# Patient Record
Sex: Male | Born: 1970 | Race: Black or African American | Hispanic: No | Marital: Married | Smoking: Never smoker
Health system: Southern US, Community
[De-identification: ages and names within clinical notes are randomized; demographics above are authoritative.]

## PROBLEM LIST (undated history)

## (undated) DIAGNOSIS — M7662 Achilles tendinitis, left leg: Secondary | ICD-10-CM

## (undated) DIAGNOSIS — I10 Essential (primary) hypertension: Secondary | ICD-10-CM

## (undated) DIAGNOSIS — I493 Ventricular premature depolarization: Secondary | ICD-10-CM

## (undated) DIAGNOSIS — E785 Hyperlipidemia, unspecified: Secondary | ICD-10-CM

## (undated) DIAGNOSIS — I160 Hypertensive urgency: Secondary | ICD-10-CM

## (undated) DIAGNOSIS — E119 Type 2 diabetes mellitus without complications: Secondary | ICD-10-CM

## (undated) DIAGNOSIS — M7661 Achilles tendinitis, right leg: Secondary | ICD-10-CM

## (undated) HISTORY — DX: Hypertensive urgency: I16.0

## (undated) HISTORY — DX: Achilles tendinitis, right leg: M76.61

## (undated) HISTORY — DX: Type 2 diabetes mellitus without complications: E11.9

## (undated) HISTORY — DX: Achilles tendinitis, right leg: M76.62

## (undated) HISTORY — DX: Ventricular premature depolarization: I49.3

---

## 2012-11-06 ENCOUNTER — Encounter (HOSPITAL_BASED_OUTPATIENT_CLINIC_OR_DEPARTMENT_OTHER): Payer: Self-pay | Admitting: Emergency Medicine

## 2012-11-06 ENCOUNTER — Emergency Department (HOSPITAL_BASED_OUTPATIENT_CLINIC_OR_DEPARTMENT_OTHER)
Admission: EM | Admit: 2012-11-06 | Discharge: 2012-11-06 | Disposition: A | Discharge status: Home / Self Care | Payer: Worker's Compensation | Attending: Emergency Medicine | Admitting: Emergency Medicine

## 2012-11-06 DIAGNOSIS — Y929 Unspecified place or not applicable: Secondary | ICD-10-CM | POA: Insufficient documentation

## 2012-11-06 DIAGNOSIS — Y990 Civilian activity done for income or pay: Secondary | ICD-10-CM | POA: Insufficient documentation

## 2012-11-06 DIAGNOSIS — S81811A Laceration without foreign body, right lower leg, initial encounter: Secondary | ICD-10-CM

## 2012-11-06 DIAGNOSIS — Z8639 Personal history of other endocrine, nutritional and metabolic disease: Secondary | ICD-10-CM | POA: Insufficient documentation

## 2012-11-06 DIAGNOSIS — Z862 Personal history of diseases of the blood and blood-forming organs and certain disorders involving the immune mechanism: Secondary | ICD-10-CM | POA: Insufficient documentation

## 2012-11-06 DIAGNOSIS — S81809A Unspecified open wound, unspecified lower leg, initial encounter: Secondary | ICD-10-CM | POA: Insufficient documentation

## 2012-11-06 DIAGNOSIS — S81009A Unspecified open wound, unspecified knee, initial encounter: Secondary | ICD-10-CM | POA: Insufficient documentation

## 2012-11-06 DIAGNOSIS — Z79899 Other long term (current) drug therapy: Secondary | ICD-10-CM | POA: Insufficient documentation

## 2012-11-06 DIAGNOSIS — I10 Essential (primary) hypertension: Secondary | ICD-10-CM | POA: Insufficient documentation

## 2012-11-06 DIAGNOSIS — Y9301 Activity, walking, marching and hiking: Secondary | ICD-10-CM | POA: Insufficient documentation

## 2012-11-06 DIAGNOSIS — W268XXA Contact with other sharp object(s), not elsewhere classified, initial encounter: Secondary | ICD-10-CM | POA: Insufficient documentation

## 2012-11-06 DIAGNOSIS — Z23 Encounter for immunization: Secondary | ICD-10-CM | POA: Insufficient documentation

## 2012-11-06 HISTORY — DX: Hyperlipidemia, unspecified: E78.5

## 2012-11-06 HISTORY — DX: Essential (primary) hypertension: I10

## 2012-11-06 MED ORDER — TETANUS-DIPHTH-ACELL PERTUSSIS 5-2.5-18.5 LF-MCG/0.5 IM SUSP
0.5000 mL | Freq: Once | INTRAMUSCULAR | Status: AC
  Administered 2012-11-06: 0.5 mL via INTRAMUSCULAR
  Filled 2012-11-06: qty 0.5

## 2012-11-06 MED ORDER — LIDOCAINE-EPINEPHRINE 2 %-1:100000 IJ SOLN
INTRAMUSCULAR | Status: AC
  Administered 2012-11-06: 03:00:00
  Filled 2012-11-06: qty 1

## 2012-11-06 NOTE — ED Notes (Signed)
Pt reports that he is on one additional bp medication and cholesterol medication but is unsure of the names

## 2012-11-06 NOTE — ED Notes (Signed)
Pt states that he had he was walking around at work and hit a nail with anterior lateral leg causing a puncture wound

## 2012-11-06 NOTE — ED Provider Notes (Signed)
History     CSN: [id number]  Arrival date & time 11/06/12  0209   First MD Initiated Contact with Patient 11/06/12 0226      Chief Complaint  Patient presents with  . Leg Injury    (Consider location/radiation/quality/duration/timing/severity/associated sxs/prior treatment) Patient is a 42 y.o. male presenting with skin laceration. The history is provided by the patient.  Laceration Location:  Leg Leg laceration location:  R lower leg Length (cm):  3 Depth:  Through underlying tissue Quality: jagged   Bleeding: controlled   Injury mechanism: Patient lacerated leg on nail sticking out of board at work.  Went through lower leg and was sticking out.  Pain details:    Quality:  Aching   Severity:  Mild   Timing:  Constant Foreign body present:  No foreign bodies Relieved by:  Nothing Worsened by:  Nothing tried Ineffective treatments:  None tried Tetanus status:  Out of date   Past Medical History  Diagnosis Date  . Hypertension   . Hyperlipidemia     History reviewed. No pertinent past surgical history.  History reviewed. No pertinent family history.  History  Substance Use Topics  . Smoking status: Never Smoker   . Smokeless tobacco: Not on file  . Alcohol Use: Yes     Comment: occasionally      Review of Systems  All other systems reviewed and are negative.    Allergies  Review of patient's allergies indicates no known allergies.  Home Medications   Current Outpatient Rx  Name  Route  Sig  Dispense  Refill  . lisinopril (PRINIVIL,ZESTRIL) 10 MG tablet   Oral   Take 10 mg by mouth daily.           BP 154/106  Pulse 103  Temp(Src) 98.5 F (36.9 C) (Oral)  Resp 18  SpO2 98%  Physical Exam  Vitals reviewed. Constitutional: He is oriented to person, place, and time. He appears well-developed and well-nourished.  HENT:  Head: Normocephalic and atraumatic.  Eyes: Pupils are equal, round, and reactive to light.  Musculoskeletal: Normal  range of motion. He exhibits no edema.       Legs: 3 cm entry wound with .5 cm exit.  Probe connects wounds and irrigation through both.   Neurological: He is alert and oriented to person, place, and time. He has normal strength and normal reflexes. No sensory deficit.    ED Course  LACERATION REPAIR Date/Time: 11/06/2012 2:46 AM Performed by: Hilario Quarry Authorized by: Hilario Quarry Consent: Verbal consent not obtained. Risks and benefits: risks, benefits and alternatives were discussed Patient identity confirmed: verbally with patient Time out: Immediately prior to procedure a "time out" was called to verify the correct patient, procedure, equipment, support staff and site/side marked as required. Body area: lower extremity Location details: right lower leg Foreign bodies: unknown Tendon involvement: none Nerve involvement: none Anesthesia: local infiltration Local anesthetic: lidocaine 1% with epinephrine Anesthetic total: 2 ml Patient sedated: no Preparation: Patient was prepped and draped in the usual sterile fashion. Irrigation solution: saline Irrigation method: syringe Amount of cleaning: extensive Debridement: moderate Degree of undermining: extensive Skin closure: staples Number of sutures: 3 Technique: simple Approximation difficulty: simple Dressing: 4x4 sterile gauze   (including critical care time)  Labs Reviewed - No data to display No results found.   No diagnosis found.    MDM    Discussed with patient to be especially vigilant for s/s infection and recheck if any, ow  patient to f/u for staple removal in 8 days.       Hilario Quarry, MD 11/07/12 [phone]

## 2020-01-06 ENCOUNTER — Other Ambulatory Visit: Payer: Self-pay

## 2020-01-06 ENCOUNTER — Ambulatory Visit (INDEPENDENT_AMBULATORY_CARE_PROVIDER_SITE_OTHER): Payer: BC Managed Care – PPO

## 2020-01-06 ENCOUNTER — Ambulatory Visit: Payer: BC Managed Care – PPO | Admitting: Podiatry

## 2020-01-06 ENCOUNTER — Encounter: Payer: Self-pay | Admitting: Podiatry

## 2020-01-06 DIAGNOSIS — M19072 Primary osteoarthritis, left ankle and foot: Secondary | ICD-10-CM | POA: Diagnosis not present

## 2020-01-06 DIAGNOSIS — M19071 Primary osteoarthritis, right ankle and foot: Secondary | ICD-10-CM | POA: Diagnosis not present

## 2020-01-06 DIAGNOSIS — M7661 Achilles tendinitis, right leg: Secondary | ICD-10-CM

## 2020-01-06 DIAGNOSIS — M778 Other enthesopathies, not elsewhere classified: Secondary | ICD-10-CM

## 2020-01-06 DIAGNOSIS — M7662 Achilles tendinitis, left leg: Secondary | ICD-10-CM

## 2020-01-06 MED ORDER — MELOXICAM 15 MG PO TABS: 15.0000 mg | ORAL_TABLET | Freq: Every day | ORAL | 1 refills | Status: AC

## 2020-01-06 NOTE — Progress Notes (Signed)
   HPI: 49 y.o. male presenting today as a new patient for evaluation of bilateral foot pain is been going on for several years.  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  By the end of his shift he has significant pain and tenderness.  He denies injury.  He has tried some stability shoes in the past that have helped.  He presents for further treatment evaluation  Past Medical History:  Diagnosis Date  . Hyperlipidemia   . Hypertension      Physical Exam: General: The patient is alert and oriented x3 in no acute distress.  Dermatology: Skin is warm, dry and supple bilateral lower extremities. Negative for open lesions or macerations.  Vascular: Palpable pedal pulses bilaterally. No edema or erythema noted. Capillary refill within normal limits.  Neurological: Epicritic and protective threshold grossly intact bilaterally.   Musculoskeletal Exam: Range of motion within normal limits to all pedal and ankle joints bilateral. Muscle strength 5/5 in all groups bilateral.  There is collapse of the medial longitudinal arch consistent with pes planus.  There is also pain on palpation to the left Achilles tendon consistent with an Achilles tendinitis  Radiographic Exam:  Normal osseous mineralization. Joint spaces preserved. No fracture/dislocation/boney destruction.  Pes planus noted on lateral view with a decreased calcaneal inclination angle and metatarsal declination angle.  Assessment: 1.  Achilles tendinitis left 2.  Pes planus bilateral 3.  Inflammatory capsulitis both feet   Plan of Care:  1. Patient evaluated. X-Rays reviewed.  2.  Appointment with Pedorthist for custom molded orthotics 3.  Prescription for meloxicam 15 mg daily as needed 4.  Continue wearing good stability shoes at work 5.  Return to clinic as needed      Felecia Shelling, DPM [facility]  Dr. Felecia Shelling, DPM    [address] [place], [place] [address]                Office [phone]  Fax [phone]

## 2020-01-20 ENCOUNTER — Other Ambulatory Visit: Payer: BC Managed Care – PPO | Admitting: Orthotics

## 2020-12-02 ENCOUNTER — Other Ambulatory Visit: Payer: Self-pay | Admitting: Orthopedic Surgery

## 2020-12-02 DIAGNOSIS — M542 Cervicalgia: Secondary | ICD-10-CM

## 2020-12-02 DIAGNOSIS — M545 Low back pain, unspecified: Secondary | ICD-10-CM

## 2020-12-19 ENCOUNTER — Ambulatory Visit
Admission: RE | Admit: 2020-12-19 | Discharge: 2020-12-19 | Disposition: A | Discharge status: Home / Self Care | Payer: BC Managed Care – PPO | Source: Ambulatory Visit | Attending: Orthopedic Surgery | Admitting: Orthopedic Surgery

## 2020-12-19 ENCOUNTER — Other Ambulatory Visit: Payer: Self-pay

## 2020-12-19 DIAGNOSIS — M542 Cervicalgia: Secondary | ICD-10-CM

## 2020-12-19 DIAGNOSIS — M545 Low back pain, unspecified: Secondary | ICD-10-CM

## 2021-07-10 IMAGING — MR MR LUMBAR SPINE W/O CM
4 of 5 series · 22 of 48 positions shown · non-contrast
Comparison: None.

CLINICAL DATA: 49-year-old male with low back pain radiating down
the left leg onset 10 weeks ago when standing up from beach chair.

EXAM:
MRI LUMBAR SPINE WITHOUT CONTRAST
TECHNIQUE: Multiplanar, multisequence MR imaging of the lumbar spine was
performed. No intravenous contrast was administered.

[Series 5: T2 · sagittal · 4.0mm · 1.09mm/px · 6 of 15 slices shown (1 of 2)]
[im 1/15]
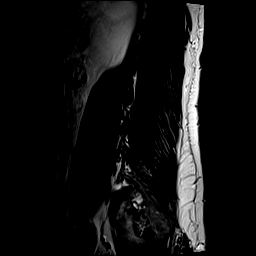
[im 3/15]
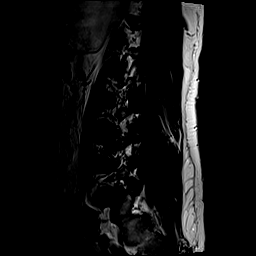
[im 6/15]
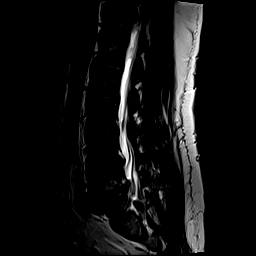
[im 9/15]
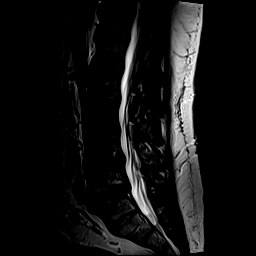
[im 12/15]
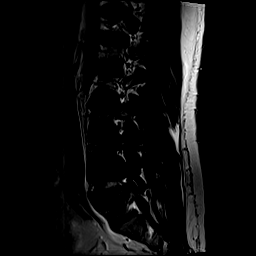
[im 15/15]
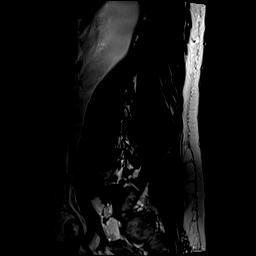

[Series 6: T1 · sagittal · 4.0mm · 1.09mm/px · 4 of 15 slices shown (1 of 2)]
[im 1/15]
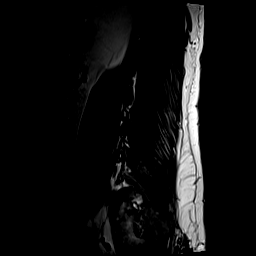
[im 3/15]
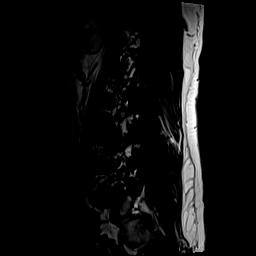
[im 9/15]
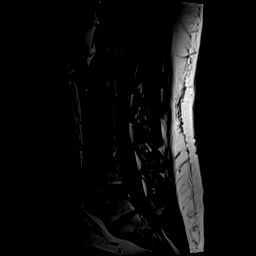
[im 15/15]
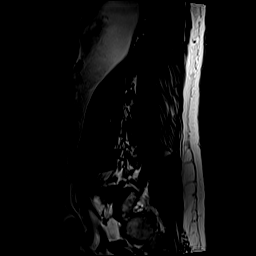

[Series 10: T1 · axial · 4.0mm · 0.28mm/px · z∈[-59,+89]mm · 3 of 39 slices shown (2 of 2)]
[im 6/39]
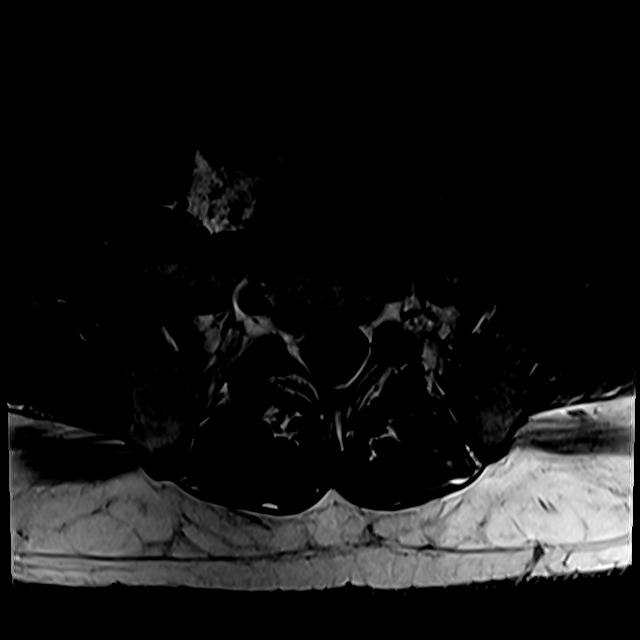
[im 20/39]
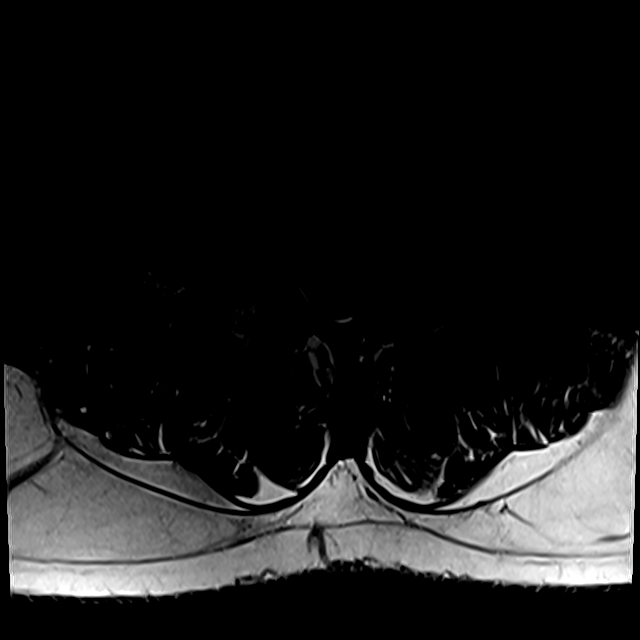
[im 33/39]
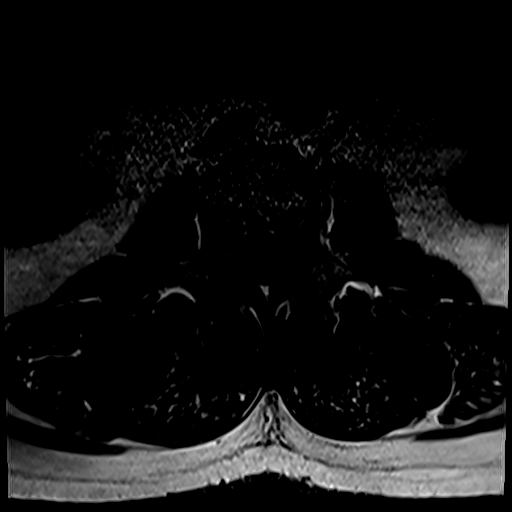

[Series 13: T2 · axial · 4.0mm · 0.28mm/px · z∈[-84,+119]mm · 9 of 39 slices shown (2 of 2)]
[im 1/39]
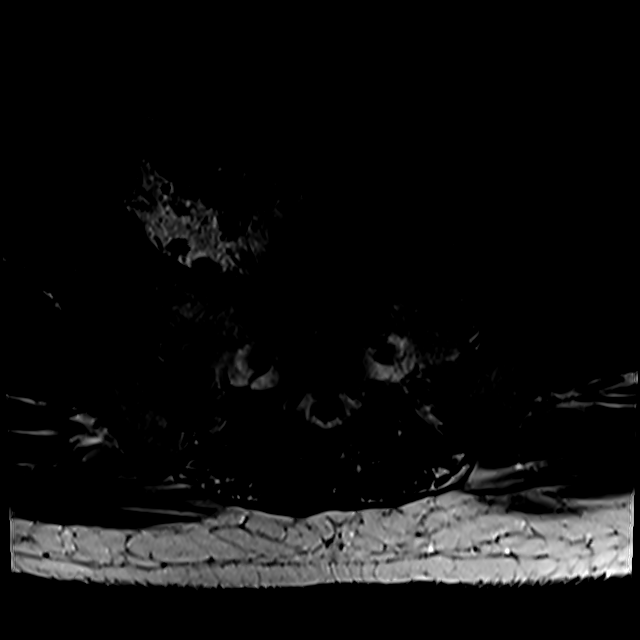
[im 6/39]
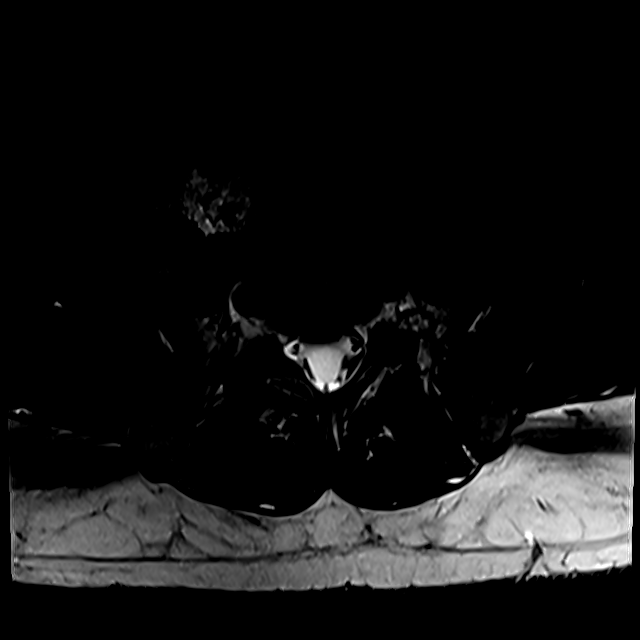
[im 11/39]
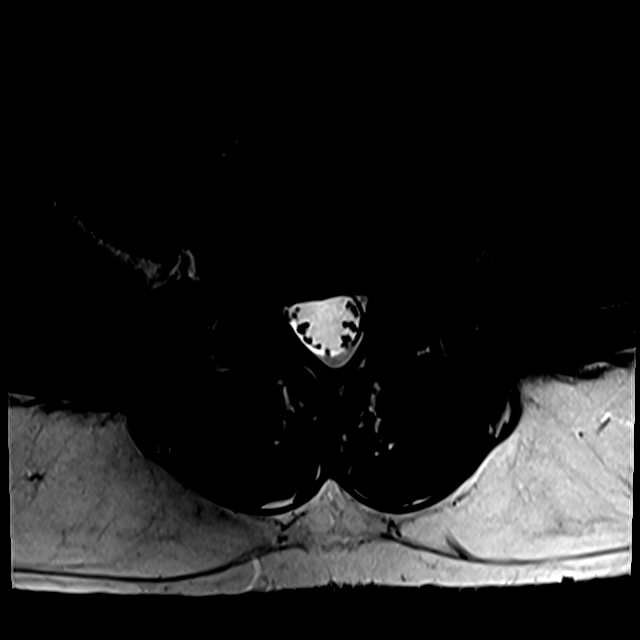
[im 17/39]
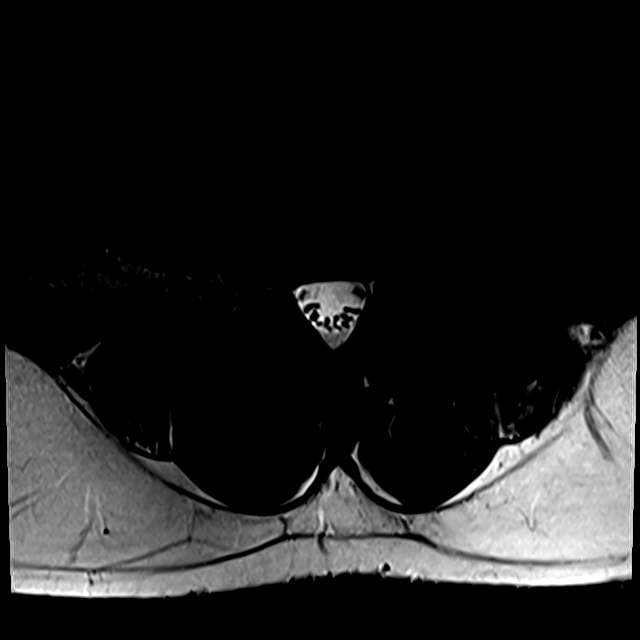
[im 20/39]
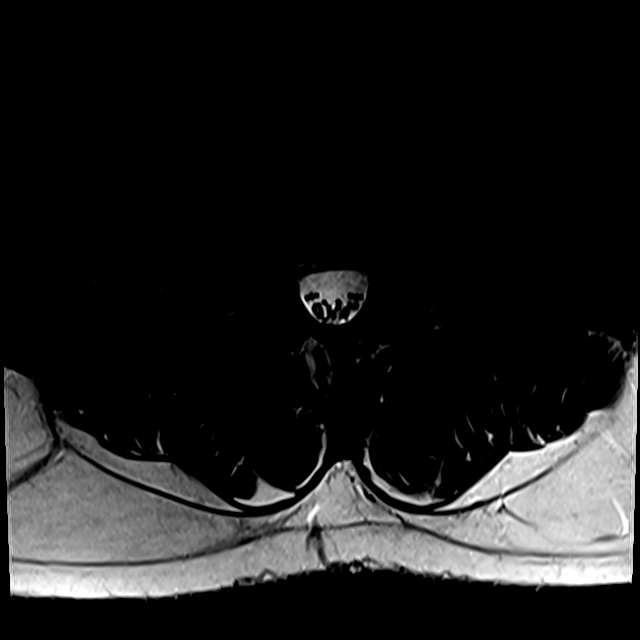
[im 22/39]
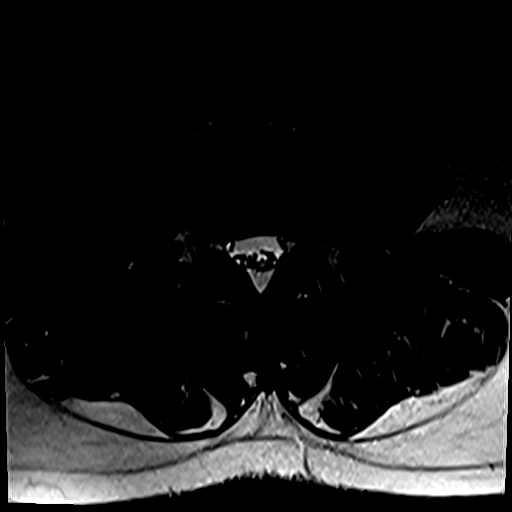
[im 28/39]
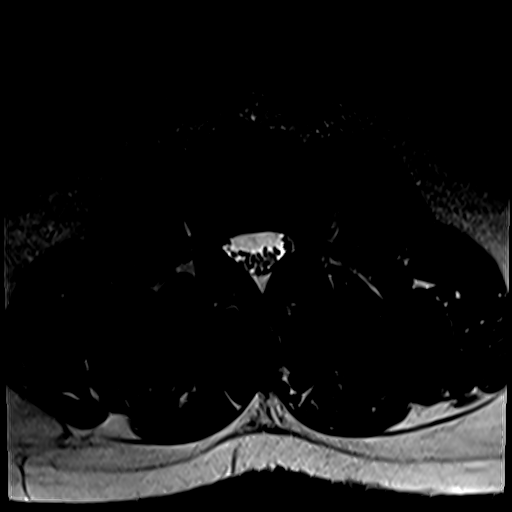
[im 33/39]
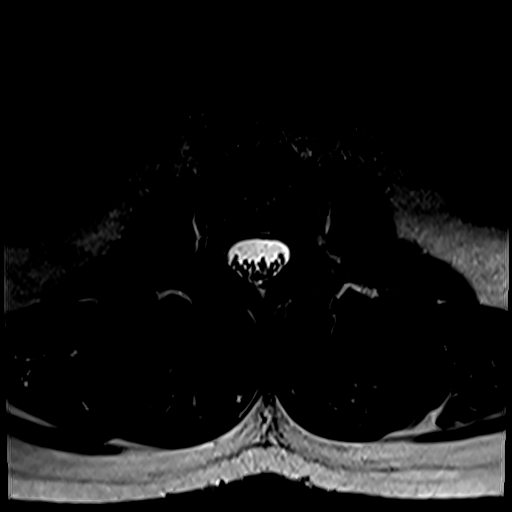
[im 39/39]
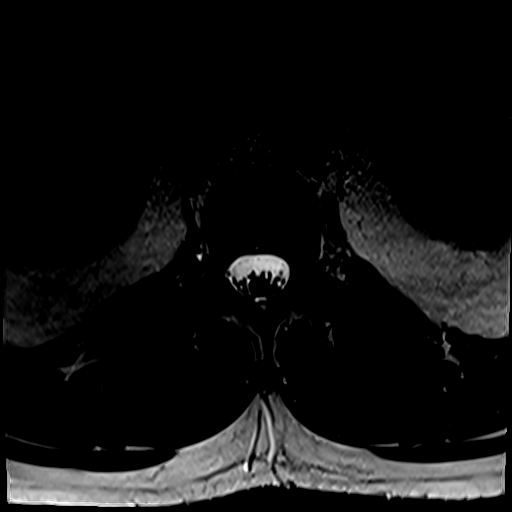

[22 of 48 positions shown; findings below may reference images not displayed]

FINDINGS: Segmentation: Mildly transitional anatomy. For the purposes of this
report the lowest visible ribs are designated T12, resulting in a
vestigial S1-S2 disc space, although the S1 level otherwise appears
fully sacralized. Correlation with radiographs is recommended prior
to any operative intervention.

Alignment: Mild straightening of lumbar lordosis. No significant
spondylolisthesis.

Vertebrae: Chronic degenerative endplate marrow signal changes at
L5-S1. Lesser chronic endplate degeneration at L4-L5. Background
bone marrow signal is within normal limits. No marrow edema or
evidence of acute osseous abnormality. Intact visible sacrum and SI
joints.

Conus medullaris and cauda equina: Conus extends to the L1 level. No
lower spinal cord or conus signal abnormality.

Paraspinal and other soft tissues: Negative.

Disc levels:

T11-T12: Partially visible left paracentral small disc protrusion.
Mild posterior element hypertrophy. No definite spinal stenosis. Up
to mild T11 foraminal stenosis.

T12-L1:  Negative.

L1-L2:  Negative.

L2-L3:  Negative.

L3-L4: Mild disc bulging greater on the left. Mild facet and
ligament flavum hypertrophy. No spinal or lateral recess stenosis,
but mild to moderate left L3 neural foraminal stenosis.

L4-L5: Disc desiccation and mild disc space loss. Circumferential
disc bulge. Mild facet hypertrophy. No spinal or lateral recess
stenosis. Mild bilateral L4 foraminal stenosis.

L5-S1: Disc desiccation, vacuum disc, disc space loss and
circumferential disc osteophyte complex eccentric to the right.
Broad-based bilateral foraminal involvement. Mild facet hypertrophy.
No spinal or lateral recess stenosis. Moderate to severe L5
foraminal stenosis greater on the left, where a component of left
foraminal disc herniation is difficult to exclude.

S1-S2: Vestigial disc space, otherwise negative.
IMPRESSION: 1. Mildly transitional anatomy. Correlation with radiographs is
recommended prior to any operative intervention.

2. Symptomatic level favored to be L5-S1 where there is advanced
chronic disc degeneration with endplate spurring. Severe involvement
of the left neural foramen, where a small component of foraminal
disc herniation is difficult to exclude. Subsequent up to severe
left and moderate right foraminal stenosis. Query Left L5
radiculitis.

3. There is also left foraminal disc bulging at L3-L4 with up to
moderate left foraminal stenosis. Query Left L3 radiculitis.

4. L4-L5 degeneration with only mild foraminal stenosis.

## 2022-02-13 ENCOUNTER — Other Ambulatory Visit: Payer: Self-pay

## 2022-02-13 ENCOUNTER — Emergency Department (HOSPITAL_BASED_OUTPATIENT_CLINIC_OR_DEPARTMENT_OTHER)
Admission: EM | Admit: 2022-02-13 | Discharge: 2022-02-13 | Disposition: A | Discharge status: Home / Self Care | Payer: BC Managed Care – PPO | Attending: Emergency Medicine | Admitting: Emergency Medicine

## 2022-02-13 ENCOUNTER — Emergency Department (HOSPITAL_BASED_OUTPATIENT_CLINIC_OR_DEPARTMENT_OTHER): Payer: BC Managed Care – PPO

## 2022-02-13 ENCOUNTER — Encounter (HOSPITAL_BASED_OUTPATIENT_CLINIC_OR_DEPARTMENT_OTHER): Payer: Self-pay | Admitting: Emergency Medicine

## 2022-02-13 DIAGNOSIS — I493 Ventricular premature depolarization: Secondary | ICD-10-CM | POA: Diagnosis not present

## 2022-02-13 DIAGNOSIS — E119 Type 2 diabetes mellitus without complications: Secondary | ICD-10-CM | POA: Diagnosis not present

## 2022-02-13 DIAGNOSIS — I10 Essential (primary) hypertension: Secondary | ICD-10-CM | POA: Diagnosis not present

## 2022-02-13 DIAGNOSIS — R079 Chest pain, unspecified: Secondary | ICD-10-CM | POA: Diagnosis present

## 2022-02-13 LAB — BASIC METABOLIC PANEL
Anion gap: 8 (ref 5–15)
BUN: 28 mg/dL — ABNORMAL HIGH (ref 6–20)
CO2: 24 mmol/L (ref 22–32)
Calcium: 9.1 mg/dL (ref 8.9–10.3)
Chloride: 106 mmol/L (ref 98–111)
Creatinine, Ser: 1.2 mg/dL (ref 0.61–1.24)
GFR, Estimated: >60 mL/min (ref >60)
Glucose, Bld: 102 mg/dL — ABNORMAL HIGH (ref 70–99)
Potassium: 3.7 mmol/L (ref 3.5–5.1)
Sodium: 138 mmol/L (ref 135–145)

## 2022-02-13 LAB — CBC
HCT: 42.2 % (ref 39.0–52.0)
Hemoglobin: 14.4 g/dL (ref 13.0–17.0)
MCH: 30.1 pg (ref 26.0–34.0)
MCHC: 34.1 g/dL (ref 30.0–36.0)
MCV: 88.1 fL (ref 80.0–100.0)
Platelets: 277 10*3/uL (ref 150–400)
RBC: 4.79 MIL/uL (ref 4.22–5.81)
RDW: 13.2 % (ref 11.5–15.5)
WBC: 7 10*3/uL (ref 4.0–10.5)
nRBC: 0 % (ref 0.0–0.2)

## 2022-02-13 LAB — TROPONIN I (HIGH SENSITIVITY): Troponin I (High Sensitivity): 3 ng/L (ref <18)

## 2022-02-13 NOTE — ED Provider Notes (Signed)
[facility] Provider Note: Ruben Dell, MD, FACEP  CSN: [id number] MRN: [id number] ARRIVAL: 02/13/22 at 0330 ROOM: MH10/[facility]10   CHIEF COMPLAINT  Chest Pain   HISTORY OF PRESENT ILLNESS  02/13/22 5:04 AM Ruben Tyler is a 51 y.o. male who has had about 2 weeks of intermittent chest pain.  The chest pain and is in his left upper chest.  It is sharp and lasts only 1 to 2 seconds at a time.  Nothing brings it on.  He has also been having a more achy, constant pain in his left arm, worse with bending of his left elbow.  He is not sure if the 2 pains are related.  Rates his pain as a 7 out of 10.  He is not having shortness of breath.   Past Medical History:  Diagnosis Date   Diabetes mellitus without complication (HCC)    Hyperlipidemia    Hypertension     No past surgical history on file.  No family history on file.  Social History   Tobacco Use   Smoking status: Never   Smokeless tobacco: Never  Substance Use Topics   Alcohol use: Yes    Comment: occasionally   Drug use: Never    Prior to Admission medications   Medication Sig Start Date End Date Taking? Authorizing Provider  lisinopril (PRINIVIL,ZESTRIL) 10 MG tablet Take 10 mg by mouth daily.    [provider]    Allergies Patient has no known allergies.   REVIEW OF SYSTEMS  Negative except as noted here or in the History of Present Illness.   PHYSICAL EXAMINATION  Initial Vital Signs Blood pressure (!) 150/100, pulse 85, temperature 98.6 F (37 C), temperature source Oral, height 5\' 10"  (1.778 m), weight 122.5 kg, SpO2 97 %.  Examination General: Well-developed, well-nourished male in no acute distress; appearance consistent with age of record HENT: normocephalic; atraumatic Eyes: pupils equal, round and reactive to light; extraocular muscles intact Neck: supple Heart: regular rate and rhythm Lungs: clear to auscultation bilaterally Abdomen: soft; nondistended; nontender; bowel  sounds present Extremities: No deformity; full range of motion; pulses normal Neurologic: Awake, alert and oriented; motor function intact in all extremities and symmetric; no facial droop Skin: Warm and dry Psychiatric: Normal mood and affect   RESULTS  Summary of this visit's results, reviewed and interpreted by myself:   EKG Interpretation  Date/Time:  Sunday February 13 2022 03:40:43 EDT Ventricular Rate:  84 PR Interval:  158 QRS Duration: 86 QT Interval:  370 QTC Calculation: 437 R Axis:   -36 Text Interpretation: Normal sinus rhythm Left axis deviation Otherwise normal ECG No previous ECGs available Confirmed by Macklyn Glandon, 03-01-1986 (Jonny Ruiz) on 02/13/2022 5:04:50 AM       Laboratory Studies: Results for orders placed or performed during the hospital encounter of 02/13/22 (from the past 24 hour(s))  Basic metabolic panel     Status: Abnormal   Collection Time: 02/13/22  3:57 AM  Result Value Ref Range   Sodium 138 135 - 145 mmol/L   Potassium 3.7 3.5 - 5.1 mmol/L   Chloride 106 98 - 111 mmol/L   CO2 24 22 - 32 mmol/L   Glucose, Bld 102 (H) 70 - 99 mg/dL   BUN 28 (H) 6 - 20 mg/dL   Creatinine, Ser 02/15/22 0.61 - 1.24 mg/dL   Calcium 9.1 8.9 - 3.78 mg/dL   GFR, Estimated 58.8 >50 mL/min   Anion gap 8 5 - 15  CBC  Status: None   Collection Time: 02/13/22  3:57 AM  Result Value Ref Range   WBC 7.0 4.0 - 10.5 K/uL   RBC 4.79 4.22 - 5.81 MIL/uL   Hemoglobin 14.4 13.0 - 17.0 g/dL   HCT 78.6 75.4 - 49.2 %   MCV 88.1 80.0 - 100.0 fL   MCH 30.1 26.0 - 34.0 pg   MCHC 34.1 30.0 - 36.0 g/dL   RDW 01.0 07.1 - 21.9 %   Platelets 277 150 - 400 K/uL   nRBC 0.0 0.0 - 0.2 %  Troponin I (High Sensitivity)     Status: None   Collection Time: 02/13/22  3:57 AM  Result Value Ref Range   Troponin I (High Sensitivity) 3 <18 ng/L   Imaging Studies: DG Chest 2 View  Result Date: 02/13/2022 CLINICAL DATA:  Chest pain and left arm pain for 2 weeks, initial encounter EXAM: CHEST - 2 VIEW  COMPARISON:  None Available. FINDINGS: The heart size and mediastinal contours are within normal limits. Both lungs are clear. The visualized skeletal structures are unremarkable. IMPRESSION: No active cardiopulmonary disease. Electronically Signed   By: Alcide Clever M.D.   On: 02/13/2022 03:59    ED COURSE and MDM  Nursing notes, initial and subsequent vitals signs, including pulse oximetry, reviewed and interpreted by myself.  Vitals:   02/13/22 0338 02/13/22 0338  BP:  (!) 150/100  Pulse:  85  Temp:  98.6 F (37 C)  TempSrc:  Oral  SpO2:  97%  Weight: 122.5 kg   Height: 5\' 10"  (1.778 m)    Medications - No data to display  The patient's EKG is nonischemic, his chest x-ray is unremarkable and his troponin is well within normal limits.  His rhythm strip was reviewed and he is having frequent PVCs but with no sustained runs.  I suspect his perceived sharp, transient pains are in fact PVCs.  We will refer him to cardiology for further work-up.  PROCEDURES  Procedures   ED DIAGNOSES     ICD-10-CM   1. PVC's (premature ventricular contractions)  I49.3 Ambulatory referral to Cardiology         , MD 02/13/22 [phone]

## 2022-02-13 NOTE — ED Triage Notes (Signed)
Chest pain and soreness in left arm X 2 weeks worse tonight with fatigue.

## 2022-03-14 NOTE — Progress Notes (Signed)
Cardiology Office Note:    Date:  03/15/2022   ID:  Ruben Tyler, DOB 1970/09/24, MRN [id number]  PCP:  Pcp, No  Cardiologist:  None   Referring MD: Paula Libra, MD   No chief complaint on file.   History of Present Illness:    Ruben Tyler is a 51 y.o. male with a hx of chest pain and referred to cardiology after recent ER visit.  ER history 02/17/2022: "51 y.o. male who has had about 2 weeks of intermittent chest pain. The chest pain and is in his left upper chest. It is sharp and lasts only 1 to 2 seconds at a time. Nothing brings it on. He has also been having a more achy, constant pain in his left arm, worse with bending of his left elbow. He is not sure if the 2 pains are related. Rates his pain as a 7 out of 10. He is not having shortness of breath."  He has had sharp shooting left parasternal chest discomfort off and on for 2 to 3 week.  It occurs randomly and duration is seconds..  There is no associated arm or neck discomfort.  He does not smoke cigarettes.  There is a family history of heart disease with mother dying of a heart problem, father having valvular heart disease, sister with high blood pressure, and another brother with an irregular heartbeat.  His grandfather, paternal, died of coronary artery disease.  He denies dyspnea on exertion, orthopnea, PND, palpitations, and any known diagnosis of heart disease.  Uses alcohol and caffeine.  Does not smoke.  20-year history of diabetes and hyperlipidemia along with high blood pressure.  Has not had any of his medications yet this morning.  He works the night shift at [employer] and has not made it home yet from work to take his medications.  He is accompanied by one of his daughters.  Past Medical History:  Diagnosis Date   Achilles tendinitis of both lower extremities    DM (diabetes mellitus) (HCC)    Hyperlipidemia    Hypertension    Hypertensive urgency    PVC (premature ventricular contraction)      History reviewed. No pertinent surgical history.  Current Medications: Current Meds  Medication Sig   atorvastatin (LIPITOR) 80 MG tablet Take 80 mg by mouth daily.   JANUMET XR [phone] MG TB24 Take 1 tablet by mouth daily.   meloxicam (MOBIC) 15 MG tablet Take 15 mg by mouth daily.   Olmesartan-amLODIPine-HCTZ 40-5-25 MG TABS Take 1 tablet by mouth daily.     Allergies:   Celecoxib   Social History   Socioeconomic History   Marital status: Married    Spouse name: Not on file   Number of children: Not on file   Years of education: Not on file   Highest education level: Not on file  Occupational History   Not on file  Tobacco Use   Smoking status: Never   Smokeless tobacco: Never  Substance and Sexual Activity   Alcohol use: Yes    Comment: occasionally   Drug use: Never   Sexual activity: Not on file  Other Topics Concern   Not on file  Social History Narrative   Not on file   Social Determinants of Health   Financial Resource Strain: Not on file  Food Insecurity: Not on file  Transportation Needs: Not on file  Physical Activity: Not on file  Stress: Not on file  Social Connections: Not on file  Family History: The patient's family history is not on file.  ROS:   Please see the history of present illness.    Snores.  He has to try to sleep during the daytime because he works the [employer] shift at work.  He does not sleep as well as he would like to.  He feels feels tired much of the time.  No lower extremity swelling.  He does sleep propped on pillows because it is more comfortable.  He has had lumbar disc/spine discomfort and has some radiation of pain into his left upper thigh.  All other systems reviewed and are negative.  EKGs/Labs/Other Studies Reviewed:    The following studies were reviewed today: Laboratory data 2022 [facility]: Hemoglobin A1c 9.4 (2022) Total cholesterol 230 with LDL of 121  EKG:  EKG performed 02/13/2022 demonstrated  normal sinus rhythm with left anterior hemiblock.  Recent Labs: 02/13/2022: BUN 28; Creatinine, Ser 1.20; Hemoglobin 14.4; Platelets 277; Potassium 3.7; Sodium 138  Recent Lipid Panel No results found for: "CHOL", "TRIG", "HDL", "CHOLHDL", "VLDL", "LDLCALC", "LDLDIRECT"  Physical Exam:    VS:  BP (!) 140/92   Pulse 87   Ht 5\' 10"  (1.778 m)   Wt 262 lb (118.8 kg)   SpO2 97%   BMI 37.59 kg/m     Wt Readings from Last 3 Encounters:  03/15/22 262 lb (118.8 kg)  02/13/22 270 lb (122.5 kg)     GEN: Obese. No acute distress HEENT: Normal NECK: No JVD. LYMPHATICS: No lymphadenopathy CARDIAC: No murmur. RRR S4 but no S3 gallop, or edema. VASCULAR:  Normal Pulses. No bruits. RESPIRATORY:  Clear to auscultation without rales, wheezing or rhonchi  ABDOMEN: Soft, non-tender, non-distended, No pulsatile mass, MUSCULOSKELETAL: No deformity  SKIN: Warm and dry NEUROLOGIC:  Alert and oriented x 3 PSYCHIATRIC:  Normal affect   ASSESSMENT:    1. Chest pain of uncertain etiology   2. Controlled type 2 diabetes mellitus without complication, with long-term current use of insulin ([place])   3. Hyperlipidemia LDL goal <70   4. Primary hypertension    PLAN:    In order of problems listed above:  Sounds musculoskeletal or pleuritic (less likely).  2D Doppler echocardiogram will be done to rule out pericardial disease and also assess LV size, function, and muscle thickness.  S4 gallop raises a question of left ventricular hypertrophy. Encouraged to keep hemoglobin A1c less than 7. Continue atorvastatin therapy.  Current status of lipids is not known.  Lipids were not well controlled 1 year ago.   Overall education and awareness concerning primary risk prevention was discussed in detail: LDL less than 70, hemoglobin A1c less than 7, blood pressure target less than 130/80 mmHg, >150 minutes of moderate aerobic activity per week, avoidance of smoking, weight control (via diet and exercise), and  continued surveillance/management of/for obstructive sleep apnea.    Medication Adjustments/Labs and Tests Ordered: Current medicines are reviewed at length with the patient today.  Concerns regarding medicines are outlined above.  Orders Placed This Encounter  Procedures   ECHOCARDIOGRAM COMPLETE   No orders of the defined types were placed in this encounter.   Patient Instructions  Medication Instructions:  Your physician recommends that you continue on your current medications as directed. Please refer to the Current Medication list given to you today.  *If you need a refill on your cardiac medications before your next appointment, please call your pharmacy*  Testing/Procedures: ECHO Your physician has requested that you have an echocardiogram. Echocardiography  is a painless test that uses sound waves to create images of your heart. It provides your doctor with information about the size and shape of your heart and how well your heart's chambers and valves are working. This procedure takes approximately one hour. There are no restrictions for this procedure.    Follow-Up: At [facility], you and your health needs are our priority.  As part of our continuing mission to provide you with exceptional heart care, we have created designated Provider Care Teams.  These Care Teams include your primary Cardiologist (physician) and Advanced Practice Providers (APPs -  Physician Assistants and Nurse Practitioners) who all work together to provide you with the care you need, when you need it.  Your next appointment:   Pending results of ECHO  The format for your next appointment:   In Person  Provider:   Dr. Katrinka Blazing            Signed, Lesleigh Noe, MD  03/15/2022 9:13 AM    [facility]

## 2022-03-15 ENCOUNTER — Encounter: Payer: Self-pay | Admitting: Interventional Cardiology

## 2022-03-15 ENCOUNTER — Ambulatory Visit: Payer: BC Managed Care – PPO | Attending: Interventional Cardiology | Admitting: Interventional Cardiology

## 2022-03-15 VITALS — BP 140/92 | HR 87 | Ht 70.0 in | Wt 262.0 lb

## 2022-03-15 DIAGNOSIS — I10 Essential (primary) hypertension: Secondary | ICD-10-CM

## 2022-03-15 DIAGNOSIS — Z794 Long term (current) use of insulin: Secondary | ICD-10-CM

## 2022-03-15 DIAGNOSIS — R079 Chest pain, unspecified: Secondary | ICD-10-CM | POA: Diagnosis not present

## 2022-03-15 DIAGNOSIS — E119 Type 2 diabetes mellitus without complications: Secondary | ICD-10-CM | POA: Diagnosis not present

## 2022-03-15 DIAGNOSIS — E785 Hyperlipidemia, unspecified: Secondary | ICD-10-CM

## 2022-03-15 NOTE — Patient Instructions (Signed)
Medication Instructions:  Your physician recommends that you continue on your current medications as directed. Please refer to the Current Medication list given to you today.  *If you need a refill on your cardiac medications before your next appointment, please call your pharmacy*  Testing/Procedures: ECHO Your physician has requested that you have an echocardiogram. Echocardiography is a painless test that uses sound waves to create images of your heart. It provides your doctor with information about the size and shape of your heart and how well your heart's chambers and valves are working. This procedure takes approximately one hour. There are no restrictions for this procedure.    Follow-Up: At [facility], you and your health needs are our priority.  As part of our continuing mission to provide you with exceptional heart care, we have created designated Provider Care Teams.  These Care Teams include your primary Cardiologist (physician) and Advanced Practice Providers (APPs -  Physician Assistants and Nurse Practitioners) who all work together to provide you with the care you need, when you need it.  Your next appointment:   Pending results of ECHO  The format for your next appointment:   In Person  Provider:   Dr. Tamala Julian

## 2022-03-18 ENCOUNTER — Ambulatory Visit (HOSPITAL_COMMUNITY): Payer: BC Managed Care – PPO | Attending: Interventional Cardiology

## 2022-03-18 DIAGNOSIS — R079 Chest pain, unspecified: Secondary | ICD-10-CM | POA: Diagnosis not present

## 2022-03-18 LAB — ECHOCARDIOGRAM COMPLETE
AR max vel: 1.94 cm2
AV Area VTI: 1.96 cm2
AV Area mean vel: 1.9 cm2
AV Mean grad: 8 mmHg
AV Peak grad: 17.1 mmHg
Ao pk vel: 2.07 m/s
Area-P 1/2: 3.08 cm2
S' Lateral: 3.1 cm

## 2022-03-18 MED ORDER — PERFLUTREN LIPID MICROSPHERE
3.0000 mL | INTRAVENOUS | Status: AC | PRN
  Administered 2022-03-18: 3 mL via INTRAVENOUS

## 2024-02-20 ENCOUNTER — Other Ambulatory Visit: Payer: Self-pay

## 2024-02-20 DIAGNOSIS — M51379 Other intervertebral disc degeneration, lumbosacral region without mention of lumbar back pain or lower extremity pain: Secondary | ICD-10-CM | POA: Diagnosis not present

## 2024-02-20 DIAGNOSIS — M545 Low back pain, unspecified: Secondary | ICD-10-CM | POA: Diagnosis present

## 2024-02-20 DIAGNOSIS — I1A0 Resistant hypertension: Secondary | ICD-10-CM | POA: Insufficient documentation

## 2024-02-20 NOTE — ED Triage Notes (Signed)
 Pt complaining of lower back pain that goes up the middle started this afternoon Denies any injury No other symptoms

## 2024-02-21 ENCOUNTER — Emergency Department (HOSPITAL_BASED_OUTPATIENT_CLINIC_OR_DEPARTMENT_OTHER)
Admission: EM | Admit: 2024-02-21 | Discharge: 2024-02-21 | Disposition: A | Discharge status: Home / Self Care | Attending: Emergency Medicine | Admitting: Emergency Medicine

## 2024-02-21 ENCOUNTER — Emergency Department (HOSPITAL_BASED_OUTPATIENT_CLINIC_OR_DEPARTMENT_OTHER)

## 2024-02-21 ENCOUNTER — Encounter (HOSPITAL_BASED_OUTPATIENT_CLINIC_OR_DEPARTMENT_OTHER): Payer: Self-pay

## 2024-02-21 DIAGNOSIS — M51379 Other intervertebral disc degeneration, lumbosacral region without mention of lumbar back pain or lower extremity pain: Secondary | ICD-10-CM

## 2024-02-21 DIAGNOSIS — M62838 Other muscle spasm: Secondary | ICD-10-CM

## 2024-02-21 DIAGNOSIS — I1A0 Resistant hypertension: Secondary | ICD-10-CM

## 2024-02-21 LAB — URINALYSIS, ROUTINE W REFLEX MICROSCOPIC
Bilirubin Urine: NEGATIVE
Glucose, UA: NEGATIVE mg/dL
Hgb urine dipstick: NEGATIVE
Ketones, ur: NEGATIVE mg/dL
Leukocytes,Ua: NEGATIVE
Nitrite: NEGATIVE
Protein, ur: NEGATIVE mg/dL
Specific Gravity, Urine: 1.025 (ref 1.005–1.030)
pH: 5.5 (ref 5.0–8.0)

## 2024-02-21 MED ORDER — LIDOCAINE 5 % EX PTCH
2.0000 | MEDICATED_PATCH | CUTANEOUS | Status: DC
  Administered 2024-02-21: 2 via TRANSDERMAL
  Filled 2024-02-21: qty 2

## 2024-02-21 MED ORDER — AMLODIPINE BESYLATE 5 MG PO TABS
2.5000 mg | ORAL_TABLET | Freq: Once | ORAL | Status: AC
  Administered 2024-02-21: 2.5 mg via ORAL
  Filled 2024-02-21: qty 1

## 2024-02-21 MED ORDER — LIDOCAINE 5 % EX PTCH: 1.0000 | MEDICATED_PATCH | CUTANEOUS | 0 refills | Status: AC

## 2024-02-21 MED ORDER — DEXAMETHASONE SODIUM PHOSPHATE 4 MG/ML IJ SOLN
4.0000 mg | Freq: Once | INTRAMUSCULAR | Status: AC
  Administered 2024-02-21: 4 mg via INTRAMUSCULAR
  Filled 2024-02-21: qty 1

## 2024-02-21 MED ORDER — METAXALONE 800 MG PO TABS: 800.0000 mg | ORAL_TABLET | Freq: Three times a day (TID) | ORAL | 0 refills | Status: AC

## 2024-02-21 MED ORDER — PREDNISONE 20 MG PO TABS: ORAL_TABLET | ORAL | 0 refills | Status: AC

## 2024-02-21 NOTE — ED Provider Notes (Signed)
 [facility] Provider Note   CSN: [id number] Arrival date & time: 02/20/24  2346     Patient presents with: Back Pain   Ruben Tyler is a 53 y.o. male.   The history is provided by the patient.  Back Pain Location:  Sacro-iliac joint Quality:  Shooting Radiates to:  Does not radiate Pain severity:  Severe Pain is:  Same all the time Onset quality:  Sudden Timing:  Constant Progression:  Improving Chronicity:  New Context: not MVA, not physical stress, not recent illness, not recent injury and not twisting   Relieved by:  Nothing Worsened by:  Nothing Ineffective treatments:  None tried Associated symptoms: no abdominal pain, no bladder incontinence, no bowel incontinence, no chest pain, no dysuria, no pelvic pain, no perianal numbness, no tingling, no weakness and no weight loss        Prior to Admission medications   Medication Sig Start Date End Date Taking? Authorizing Provider  atorvastatin (LIPITOR) 80 MG tablet Take 80 mg by mouth daily. 11/23/21   [provider]  JANUMET XR [phone] MG TB24 Take 1 tablet by mouth daily. 11/24/21   [provider]  meloxicam  (MOBIC ) 15 MG tablet Take 15 mg by mouth daily. 12/24/21   [provider]  Olmesartan-amLODIPine -HCTZ 40-5-25 MG TABS Take 1 tablet by mouth daily. 02/18/22   [provider]    Allergies: Celecoxib    Review of Systems  Constitutional:  Negative for weight loss.  Cardiovascular:  Negative for chest pain.  Gastrointestinal:  Negative for abdominal pain and bowel incontinence.  Genitourinary:  Negative for bladder incontinence, dysuria and pelvic pain.  Musculoskeletal:  Positive for back pain.  Neurological:  Negative for tingling and weakness.    Updated Vital Signs BP (!) 177/115   Pulse 63   Temp 98.1 F (36.7 C) (Oral)   Resp 16   Ht 5' 11 (1.803 m)   Wt 117.9 kg   SpO2 98%   BMI 36.26 kg/m   Physical Exam Vitals  and nursing note reviewed.  Constitutional:      General: He is not in acute distress.    Appearance: He is well-developed. He is not diaphoretic.  HENT:     Head: Normocephalic and atraumatic.     Nose: Nose normal.  Eyes:     Conjunctiva/sclera: Conjunctivae normal.     Pupils: Pupils are equal, round, and reactive to light.  Cardiovascular:     Rate and Rhythm: Normal rate and regular rhythm.     Pulses: Normal pulses.     Heart sounds: Normal heart sounds.  Pulmonary:     Effort: Pulmonary effort is normal.     Breath sounds: Normal breath sounds. No wheezing or rales.  Abdominal:     General: Bowel sounds are normal.     Palpations: Abdomen is soft.     Tenderness: There is no abdominal tenderness. There is no guarding or rebound.  Musculoskeletal:        General: Normal range of motion.     Cervical back: Normal, normal range of motion and neck supple.     Thoracic back: Normal.     Lumbar back: Normal.  Skin:    General: Skin is warm and dry.     Capillary Refill: Capillary refill takes less than 2 seconds.  Neurological:     General: No focal deficit present.     Mental Status: He is alert and oriented to  person, place, and time.     Deep Tendon Reflexes: Reflexes normal.     Comments: Gait is normal   Psychiatric:        Mood and Affect: Mood normal.     (all labs ordered are listed, but only abnormal results are displayed) Labs Reviewed  URINALYSIS, ROUTINE W REFLEX MICROSCOPIC    EKG: None  Radiology: No results found.   Procedures   Medications Ordered in the ED  dexamethasone  (DECADRON ) injection 4 mg (has no administration in time range)  lidocaine  (LIDODERM ) 5 % 2 patch (has no administration in time range)  amLODipine  (NORVASC ) tablet 2.5 mg (has no administration in time range)                                    Medical Decision Making Patient with back pain and spasm.    Amount and/or Complexity of Data Reviewed External Data  Reviewed: notes.    Details: Previous notes reviewed  Labs: ordered. Radiology: ordered.  Risk Prescription drug management. Risk Details: Well appearing.  No weakness nor numbness, no changes in bowel or bladder.  Patient has known hypertension and reports he is on a triple medication.  I have advised close follow up with PMD for medication adjustment.  Patient has spasm in lower back.  I have started medication for this.  Stable for discharge.  Strict return      Final diagnoses:  None    No signs of systemic illness or infection. The patient is nontoxic-appearing on exam and vital signs are within normal limits.  I have reviewed the triage vital signs and the nursing notes. Pertinent labs & imaging results that were available during my care of the patient were reviewed by me and considered in my medical decision making (see chart for details). After history, exam, and medical workup I feel the patient has been appropriately medically screened and is safe for discharge home. Pertinent diagnoses were discussed with the patient. Patient was given return precautions.  ED Discharge Orders     None          Yazmin Locher, MD 02/21/24 [id number]
